# Patient Record
Sex: Male | Born: 1995 | Race: White | Hispanic: No | Marital: Single | Smoking: Never smoker
Health system: Southern US, Community
[De-identification: ages and names within clinical notes are randomized; demographics above are authoritative.]

## PROBLEM LIST (undated history)

## (undated) DIAGNOSIS — M1990 Unspecified osteoarthritis, unspecified site: Secondary | ICD-10-CM

---
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
## 2020-08-17 ENCOUNTER — Other Ambulatory Visit: Payer: Self-pay | Admitting: Internal Medicine

## 2020-08-17 DIAGNOSIS — M5412 Radiculopathy, cervical region: Secondary | ICD-10-CM

## 2022-06-14 ENCOUNTER — Encounter (HOSPITAL_BASED_OUTPATIENT_CLINIC_OR_DEPARTMENT_OTHER): Payer: Self-pay | Admitting: Emergency Medicine

## 2022-06-14 ENCOUNTER — Emergency Department (HOSPITAL_BASED_OUTPATIENT_CLINIC_OR_DEPARTMENT_OTHER)
Admission: EM | Admit: 2022-06-14 | Discharge: 2022-06-14 | Disposition: A | Discharge status: Home / Self Care | Payer: Worker's Compensation | Attending: Emergency Medicine | Admitting: Emergency Medicine

## 2022-06-14 DIAGNOSIS — W228XXA Striking against or struck by other objects, initial encounter: Secondary | ICD-10-CM | POA: Diagnosis not present

## 2022-06-14 DIAGNOSIS — R030 Elevated blood-pressure reading, without diagnosis of hypertension: Secondary | ICD-10-CM | POA: Diagnosis not present

## 2022-06-14 DIAGNOSIS — Y990 Civilian activity done for income or pay: Secondary | ICD-10-CM | POA: Diagnosis not present

## 2022-06-14 DIAGNOSIS — Z23 Encounter for immunization: Secondary | ICD-10-CM | POA: Diagnosis not present

## 2022-06-14 DIAGNOSIS — S81812A Laceration without foreign body, left lower leg, initial encounter: Secondary | ICD-10-CM | POA: Insufficient documentation

## 2022-06-14 HISTORY — DX: Unspecified osteoarthritis, unspecified site: M19.90

## 2022-06-14 MED ORDER — TETANUS-DIPHTH-ACELL PERTUSSIS 5-2.5-18.5 LF-MCG/0.5 IM SUSY
0.5000 mL | PREFILLED_SYRINGE | Freq: Once | INTRAMUSCULAR | Status: AC
  Administered 2022-06-14: 0.5 mL via INTRAMUSCULAR
  Filled 2022-06-14: qty 0.5

## 2022-06-14 MED ORDER — LIDOCAINE-EPINEPHRINE (PF) 1 %-1:200000 IJ SOLN
10.0000 mL | Freq: Once | INTRAMUSCULAR | Status: AC
  Administered 2022-06-14: 10 mL
  Filled 2022-06-14: qty 30

## 2022-06-14 NOTE — Discharge Instructions (Addendum)
Your blood pressure was high today.  Please have it rechecked as an outpatient.  It should be checked several times over the next 1-2 weeks.  If it continues to be high, you will need to be on medication to control it.  High blood pressure which is not adequately controlled can lead to heart attacks, strokes, kidney failure.

## 2022-06-14 NOTE — ED Triage Notes (Signed)
Pt moving between docks at work and miss stepped. Lac to front of left leg, about 2-3cm

## 2022-06-14 NOTE — ED Provider Notes (Signed)
[facility] Provider Note   CSN: [id number] Arrival date & time: 06/14/22  0255     History  Chief Complaint  Patient presents with   Laceration    Wyatt Butler is a 26 y.o. male.  The history is provided by the patient.  Laceration He suffered a laceration of his left lower leg when he missed a step and hit it against a solid object.  It is unknown when his last tetanus immunization was.  He denies other injury.   Home Medications Prior to Admission medications   Not on File      Allergies    Patient has no known allergies.    Review of Systems   Review of Systems  All other systems reviewed and are negative.   Physical Exam Updated Vital Signs BP (!) 148/77 (BP Location: Right Arm)   Pulse 65   Temp 98.9 F (37.2 C) (Oral)   Resp 18   Ht 6\' 4"  (1.93 m)   Wt 115.7 kg   SpO2 98%   BMI 31.04 kg/m  Physical Exam Vitals and nursing note reviewed.  26 year old male, resting comfortably and in no acute distress. Vital signs are significant for elevated blood pressure. Oxygen saturation is 98%, which is normal. Head is normocephalic and atraumatic. PERRLA, EOMI. Oropharynx is clear. Neck is nontender and supple. Back is nontender and there is no CVA tenderness. Lungs are clear without rales, wheezes, or rhonchi. Chest is nontender. Heart has regular rate and rhythm without murmur. Abdomen is soft, flat, nontender. Extremities: Laceration is present in the anterior aspect of the left lower leg.. Skin is warm and dry without rash. Neurologic: Mental status is normal, cranial nerves are intact, moves all extremities equally.   ED Results / Procedures / Treatments    Procedures .Marland KitchenLaceration Repair  Date/Time: 06/14/2022 4:24 AM  Performed by: Delora Fuel, MD Authorized by: Delora Fuel, MD   Consent:    Consent obtained:  Verbal   Consent given by:  Patient   Risks, benefits, and alternatives were discussed: yes     Risks  discussed:  Infection, pain and poor wound healing   Alternatives discussed:  No treatment Universal protocol:    Procedure explained and questions answered to patient or proxy's satisfaction: yes     Relevant documents present and verified: yes     Required blood products, implants, devices, and special equipment available: yes     Site/side marked: yes     Immediately prior to procedure, a time out was called: yes     Patient identity confirmed:  Verbally with patient and arm band Anesthesia:    Anesthesia method:  Local infiltration   Local anesthetic:  Lidocaine 1% WITH epi Laceration details:    Location:  Leg   Leg location:  L lower leg   Length (cm):  3.5   Depth (mm):  3 Pre-procedure details:    Preparation:  Patient was prepped and draped in usual sterile fashion Exploration:    Limited defect created (wound extended): no     Hemostasis achieved with:  Direct pressure   Wound exploration: entire depth of wound visualized     Wound extent: no foreign bodies/material noted and no underlying fracture noted     Contaminated: no   Treatment:    Area cleansed with:  Saline   Amount of cleaning:  Standard   Debridement:  None   Undermining:  None   Scar revision: no  Skin repair:    Repair method:  Sutures   Suture size:  4-0   Suture material:  Prolene   Number of sutures:  4 Approximation:    Approximation:  Close Repair type:    Repair type:  Simple Post-procedure details:    Dressing:  Antibiotic ointment and sterile dressing   Procedure completion:  Tolerated well, no immediate complications     Medications Ordered in ED Medications  lidocaine-EPINEPHrine (PF) (XYLOCAINE-EPINEPHrine) 1 %-1:200000 (PF) injection 10 mL (has no administration in time range)  Tdap (BOOSTRIX) injection 0.5 mL (has no administration in time range)    ED Course/ Medical Decision Making/ A&P                           Medical Decision Making Risk Prescription drug  management.   Laceration of the left lower leg closed with sutures.  Tdap booster given.  He is advised to have sutures removed in 7-10 days.  Recommended blood pressure rechecked as an outpatient to make sure that blood pressure returns to normal range.        Final Clinical Impression(s) / ED Diagnoses Final diagnoses:  Laceration of left lower leg, initial encounter  Elevated blood pressure reading without diagnosis of hypertension    Rx / DC Orders ED Discharge Orders     None         Dione Booze, MD 06/14/22 [phone]
